# Patient Record
Sex: Female | Born: 1960
Health system: Southern US, Community
[De-identification: ages and names within clinical notes are randomized; demographics above are authoritative.]

## PROBLEM LIST (undated history)

## (undated) DIAGNOSIS — J45909 Unspecified asthma, uncomplicated: Secondary | ICD-10-CM

## (undated) HISTORY — PX: DILATION AND CURETTAGE OF UTERUS: SHX78

---

## 2016-07-26 ENCOUNTER — Other Ambulatory Visit: Payer: Self-pay | Admitting: Internal Medicine

## 2016-07-26 DIAGNOSIS — R600 Localized edema: Secondary | ICD-10-CM

## 2016-08-02 ENCOUNTER — Other Ambulatory Visit: Payer: Self-pay

## 2016-08-02 ENCOUNTER — Ambulatory Visit (HOSPITAL_COMMUNITY): Payer: 59 | Attending: Cardiology

## 2016-08-02 DIAGNOSIS — R600 Localized edema: Secondary | ICD-10-CM | POA: Diagnosis not present

## 2016-08-30 ENCOUNTER — Other Ambulatory Visit: Payer: Self-pay | Admitting: Internal Medicine

## 2016-08-30 DIAGNOSIS — R945 Abnormal results of liver function studies: Principal | ICD-10-CM

## 2016-08-30 DIAGNOSIS — R7989 Other specified abnormal findings of blood chemistry: Secondary | ICD-10-CM

## 2016-08-31 ENCOUNTER — Other Ambulatory Visit: Payer: Self-pay | Admitting: Internal Medicine

## 2016-08-31 ENCOUNTER — Ambulatory Visit
Admission: RE | Admit: 2016-08-31 | Discharge: 2016-08-31 | Disposition: A | Discharge status: Home / Self Care | Payer: 59 | Source: Ambulatory Visit | Attending: Internal Medicine | Admitting: Internal Medicine

## 2016-08-31 DIAGNOSIS — R945 Abnormal results of liver function studies: Principal | ICD-10-CM

## 2016-08-31 DIAGNOSIS — R7989 Other specified abnormal findings of blood chemistry: Secondary | ICD-10-CM

## 2016-08-31 DIAGNOSIS — K769 Liver disease, unspecified: Secondary | ICD-10-CM

## 2016-09-25 ENCOUNTER — Other Ambulatory Visit: Payer: Self-pay | Admitting: Nurse Practitioner

## 2016-09-25 ENCOUNTER — Other Ambulatory Visit (HOSPITAL_COMMUNITY)
Admission: RE | Admit: 2016-09-25 | Discharge: 2016-09-25 | Disposition: A | Discharge status: Home / Self Care | Payer: 59 | Source: Ambulatory Visit | Attending: Nurse Practitioner | Admitting: Nurse Practitioner

## 2016-09-25 DIAGNOSIS — Z124 Encounter for screening for malignant neoplasm of cervix: Secondary | ICD-10-CM | POA: Diagnosis present

## 2016-09-28 LAB — CYTOLOGY - PAP
Diagnosis: NEGATIVE
HPV: NOT DETECTED

## 2016-09-29 ENCOUNTER — Ambulatory Visit
Admission: RE | Admit: 2016-09-29 | Discharge: 2016-09-29 | Disposition: A | Discharge status: Home / Self Care | Payer: 59 | Source: Ambulatory Visit | Attending: Internal Medicine | Admitting: Internal Medicine

## 2016-09-29 DIAGNOSIS — K769 Liver disease, unspecified: Secondary | ICD-10-CM

## 2016-09-29 MED ORDER — GADOBENATE DIMEGLUMINE 529 MG/ML IV SOLN
15.0000 mL | Freq: Once | INTRAVENOUS | Status: AC | PRN
  Administered 2016-09-29: 15 mL via INTRAVENOUS

## 2016-11-30 ENCOUNTER — Other Ambulatory Visit: Payer: Self-pay | Admitting: Internal Medicine

## 2016-11-30 DIAGNOSIS — Z139 Encounter for screening, unspecified: Secondary | ICD-10-CM

## 2016-12-27 ENCOUNTER — Ambulatory Visit
Admission: RE | Admit: 2016-12-27 | Discharge: 2016-12-27 | Disposition: A | Discharge status: Home / Self Care | Payer: 59 | Source: Ambulatory Visit | Attending: Internal Medicine | Admitting: Internal Medicine

## 2016-12-27 DIAGNOSIS — Z139 Encounter for screening, unspecified: Secondary | ICD-10-CM

## 2017-01-02 ENCOUNTER — Other Ambulatory Visit: Payer: Self-pay | Admitting: Nurse Practitioner

## 2018-09-16 ENCOUNTER — Other Ambulatory Visit: Payer: Self-pay

## 2018-09-16 DIAGNOSIS — Z20822 Contact with and (suspected) exposure to covid-19: Secondary | ICD-10-CM

## 2018-09-17 LAB — NOVEL CORONAVIRUS, NAA: SARS-CoV-2, NAA: NOT DETECTED

## 2018-10-31 DIAGNOSIS — Z23 Encounter for immunization: Secondary | ICD-10-CM | POA: Diagnosis not present

## 2019-07-22 DIAGNOSIS — K760 Fatty (change of) liver, not elsewhere classified: Secondary | ICD-10-CM | POA: Diagnosis not present

## 2019-07-22 DIAGNOSIS — E559 Vitamin D deficiency, unspecified: Secondary | ICD-10-CM | POA: Diagnosis not present

## 2019-07-22 DIAGNOSIS — K219 Gastro-esophageal reflux disease without esophagitis: Secondary | ICD-10-CM | POA: Diagnosis not present

## 2019-07-22 DIAGNOSIS — Z13220 Encounter for screening for lipoid disorders: Secondary | ICD-10-CM | POA: Diagnosis not present

## 2019-07-22 DIAGNOSIS — Z23 Encounter for immunization: Secondary | ICD-10-CM | POA: Diagnosis not present

## 2019-07-22 DIAGNOSIS — Z0000 Encounter for general adult medical examination without abnormal findings: Secondary | ICD-10-CM | POA: Diagnosis not present

## 2019-07-22 DIAGNOSIS — Z1389 Encounter for screening for other disorder: Secondary | ICD-10-CM | POA: Diagnosis not present

## 2019-07-22 DIAGNOSIS — J45909 Unspecified asthma, uncomplicated: Secondary | ICD-10-CM | POA: Diagnosis not present

## 2019-07-24 ENCOUNTER — Other Ambulatory Visit: Payer: Self-pay | Admitting: Internal Medicine

## 2019-07-24 DIAGNOSIS — Z1231 Encounter for screening mammogram for malignant neoplasm of breast: Secondary | ICD-10-CM

## 2019-07-24 DIAGNOSIS — M8580 Other specified disorders of bone density and structure, unspecified site: Secondary | ICD-10-CM

## 2019-07-31 ENCOUNTER — Other Ambulatory Visit: Payer: Self-pay

## 2019-07-31 ENCOUNTER — Emergency Department (HOSPITAL_COMMUNITY): Payer: BC Managed Care – PPO

## 2019-07-31 ENCOUNTER — Emergency Department (HOSPITAL_COMMUNITY)
Admission: EM | Admit: 2019-07-31 | Discharge: 2019-07-31 | Disposition: A | Discharge status: Home / Self Care | Payer: BC Managed Care – PPO | Attending: Emergency Medicine | Admitting: Emergency Medicine

## 2019-07-31 ENCOUNTER — Encounter (HOSPITAL_COMMUNITY): Payer: Self-pay

## 2019-07-31 DIAGNOSIS — F410 Panic disorder [episodic paroxysmal anxiety] without agoraphobia: Secondary | ICD-10-CM

## 2019-07-31 DIAGNOSIS — R079 Chest pain, unspecified: Secondary | ICD-10-CM | POA: Diagnosis not present

## 2019-07-31 DIAGNOSIS — R0789 Other chest pain: Secondary | ICD-10-CM | POA: Diagnosis not present

## 2019-07-31 DIAGNOSIS — R519 Headache, unspecified: Secondary | ICD-10-CM | POA: Diagnosis not present

## 2019-07-31 DIAGNOSIS — J45909 Unspecified asthma, uncomplicated: Secondary | ICD-10-CM | POA: Diagnosis not present

## 2019-07-31 DIAGNOSIS — Z7951 Long term (current) use of inhaled steroids: Secondary | ICD-10-CM | POA: Diagnosis not present

## 2019-07-31 HISTORY — DX: Unspecified asthma, uncomplicated: J45.909

## 2019-07-31 LAB — CBC
HCT: 45.7 % (ref 36.0–46.0)
Hemoglobin: 14.5 g/dL (ref 12.0–15.0)
MCH: 29.1 pg (ref 26.0–34.0)
MCHC: 31.7 g/dL (ref 30.0–36.0)
MCV: 91.6 fL (ref 80.0–100.0)
Platelets: 224 10*3/uL (ref 150–400)
RBC: 4.99 MIL/uL (ref 3.87–5.11)
RDW: 13.4 % (ref 11.5–15.5)
WBC: 7 10*3/uL (ref 4.0–10.5)
nRBC: 0 % (ref 0.0–0.2)

## 2019-07-31 LAB — BASIC METABOLIC PANEL
Anion gap: 9 (ref 5–15)
BUN: 15 mg/dL (ref 6–20)
CO2: 24 mmol/L (ref 22–32)
Calcium: 8.8 mg/dL — ABNORMAL LOW (ref 8.9–10.3)
Chloride: 109 mmol/L (ref 98–111)
Creatinine, Ser: 0.99 mg/dL (ref 0.44–1.00)
GFR calc Af Amer: >60 mL/min (ref >60)
GFR calc non Af Amer: >60 mL/min (ref >60)
Glucose, Bld: 106 mg/dL — ABNORMAL HIGH (ref 70–99)
Potassium: 4.4 mmol/L (ref 3.5–5.1)
Sodium: 142 mmol/L (ref 135–145)

## 2019-07-31 LAB — TROPONIN I (HIGH SENSITIVITY)
Troponin I (High Sensitivity): <2 ng/L (ref <18)
Troponin I (High Sensitivity): <2 ng/L (ref <18)

## 2019-07-31 LAB — I-STAT BETA HCG BLOOD, ED (MC, WL, AP ONLY): I-stat hCG, quantitative: <5 m[IU]/mL (ref <5)

## 2019-07-31 MED ORDER — KETOROLAC TROMETHAMINE 30 MG/ML IJ SOLN
30.0000 mg | Freq: Once | INTRAMUSCULAR | Status: AC
  Administered 2019-07-31: 30 mg via INTRAVENOUS
  Filled 2019-07-31: qty 1

## 2019-07-31 MED ORDER — DIPHENHYDRAMINE HCL 50 MG/ML IJ SOLN
12.5000 mg | Freq: Once | INTRAMUSCULAR | Status: AC
  Administered 2019-07-31: 12.5 mg via INTRAVENOUS
  Filled 2019-07-31: qty 1

## 2019-07-31 MED ORDER — METOCLOPRAMIDE HCL 5 MG/ML IJ SOLN
10.0000 mg | Freq: Once | INTRAMUSCULAR | Status: AC
  Administered 2019-07-31: 10 mg via INTRAVENOUS

## 2019-07-31 MED ORDER — METOCLOPRAMIDE HCL 5 MG/ML IJ SOLN
10.0000 mg | Freq: Once | INTRAMUSCULAR | Status: DC
  Filled 2019-07-31: qty 2

## 2019-07-31 MED ORDER — SODIUM CHLORIDE 0.9% FLUSH: 3.0000 mL | Freq: Once | INTRAVENOUS | Status: DC

## 2019-07-31 NOTE — Discharge Instructions (Signed)
You were seen today for panic attack, I want you to use the attached instructions on ways to manage anxiety.  I want you to follow-up with your primary care in the next couple of days.  I also think that you had a migraine today as well, I want you to take over-the-counter NSAIDs as prescribed on the bottle if you start to feel like you have a headache again and follow-up with your primary care about that as well.  If you start having any new or worsening concerning symptoms please come back to the emergency department.  If you have any chest pain with exertion, chest pain radiating, shortness of breath, please come back to the emergency department.  Follow-up with your primary care in the next couple days.

## 2019-07-31 NOTE — ED Triage Notes (Signed)
Patient c/o mid chest tightness since 1000 today.  Patien denies any N/V or diaphoresis, but states she felt "really hot."

## 2019-07-31 NOTE — ED Provider Notes (Signed)
[facility]-EMERGENCY DEPT Provider Note   CSN: [id number] Arrival date & time: 07/31/19  1037     History Chief Complaint  Patient presents with  . Chest Pain    Kendra Daniels is a 59 y.o. female with pertinent past medical history of asthma that presents the emergency department today for chest pain and HA.  Patient states that she was at work and started having some chest tightness around 10, also admits to associated headache, shaking, feeling hot, shortness of breath.  Patient states that she has had an anxiety attack about 10 years ago, states that it did feel similar.  States that it lasted about 10 minutes.  States that the pain was in the center of her chest, denies any radiation or exertional component.  Patient states that she is never had anything like this in the last 10 years.  Denies any cardiac history.  States that she does not have any more chest pain or shortness of breath.  States that she is only left with  headache.  States that the headache is on the right side of her head with some associated right-sided blurry vision.  States that blurry vision has resolved.  States that this is a new headache for her, pain is a 6/10.  Denies any weakness, paresthesias, nausea, vomiting, diaphoresis, abdominal pain, back pain.  States this does not feel like her normal asthma.  Denies any wheezing. Denies any fevers, chills, URI like symptoms. Denies any new medications or stresors.   HPI     Past Medical History:  Diagnosis Date  . Asthma     There are no problems to display for this patient.   Past Surgical History:  Procedure Laterality Date  . DILATION AND CURETTAGE OF UTERUS       OB History   No obstetric history on file.     Family History  Problem Relation Age of Onset  . Hypertension Mother   . Heart attack Father   . Seizures Father   . Hypertension Father     Social History   Tobacco Use  . Smoking status: Never Smoker  .  Smokeless tobacco: Never Used  Vaping Use  . Vaping Use: Never used  Substance Use Topics  . Alcohol use: Never  . Drug use: Never    Home Medications Prior to Admission medications   Medication Sig Start Date End Date Taking? Authorizing Provider  albuterol (VENTOLIN HFA) 108 (90 Base) MCG/ACT inhaler Inhale 2 puffs into the lungs every 6 (six) hours as needed for wheezing or shortness of breath.  07/22/19  Yes [provider]  pantoprazole (PROTONIX) 40 MG tablet Take 40 mg by mouth daily as needed (indigestion).  07/22/19  Yes [provider]  Vitamin D, Ergocalciferol, (DRISDOL) 1.25 MG (50000 UNIT) CAPS capsule Take 50,000 Units by mouth once a week. 07/23/19  Yes [provider]    Allergies    Sulfa antibiotics  Review of Systems   Review of Systems  Constitutional: Negative for chills, diaphoresis, fatigue and fever.  HENT: Negative for congestion, sore throat and trouble swallowing.   Eyes: Negative for pain and visual disturbance.  Respiratory: Negative for cough, shortness of breath and wheezing.   Cardiovascular: Positive for chest pain. Negative for palpitations and leg swelling.  Gastrointestinal: Negative for abdominal distention, abdominal pain, diarrhea, nausea and vomiting.  Genitourinary: Negative for difficulty urinating.  Musculoskeletal: Negative for back pain, neck pain and neck stiffness.  Skin: Negative  for pallor.  Neurological: Positive for headaches. Negative for dizziness, tremors, seizures, syncope, facial asymmetry, speech difficulty, weakness, light-headedness and numbness.  Psychiatric/Behavioral: Negative for confusion.    Physical Exam Updated Vital Signs BP 126/90   Pulse 82   Temp 98 F (36.7 C) (Oral)   Resp (!) 22   Ht 5' 1.5" (1.562 m)   Wt 67.1 kg   SpO2 100%   BMI 27.51 kg/m   Physical Exam Constitutional:      General: She is not in acute distress.    Appearance: Normal appearance. She is not  ill-appearing, toxic-appearing or diaphoretic.  HENT:     Head: Normocephalic and atraumatic.     Mouth/Throat:     Mouth: Mucous membranes are moist.     Pharynx: Oropharynx is clear.  Eyes:     General: No scleral icterus.    Extraocular Movements: Extraocular movements intact.     Pupils: Pupils are equal, round, and reactive to light.  Cardiovascular:     Rate and Rhythm: Normal rate and regular rhythm.     Pulses: Normal pulses.     Heart sounds: Normal heart sounds. No murmur heard.   Pulmonary:     Effort: Pulmonary effort is normal. No tachypnea, accessory muscle usage or respiratory distress.     Breath sounds: Normal breath sounds. No stridor. No decreased breath sounds, wheezing, rhonchi or rales.  Chest:     Chest wall: No tenderness.  Abdominal:     General: Abdomen is flat. There is no distension.     Palpations: Abdomen is soft.     Tenderness: There is no abdominal tenderness. There is no guarding or rebound.  Musculoskeletal:        General: No swelling or tenderness. Normal range of motion.     Cervical back: Normal range of motion and neck supple. No rigidity.     Right lower leg: No edema.     Left lower leg: No edema.  Skin:    General: Skin is warm and dry.     Capillary Refill: Capillary refill takes less than 2 seconds.     Coloration: Skin is not pale.  Neurological:     General: No focal deficit present.     Mental Status: She is alert and oriented to person, place, and time.     Comments: Alert. Clear speech. No facial droop. CNIII-XII grossly intact. Bilateral upper and lower extremities' sensation grossly intact. 5/5 symmetric strength with grip strength and with plantar and dorsi flexion bilaterally. Patellar DTRs are 2+ and symmetric .  Gait is steady and intact    Psychiatric:        Mood and Affect: Mood normal.        Behavior: Behavior normal.     ED Results / Procedures / Treatments   Labs (all labs ordered are listed, but only  abnormal results are displayed) Labs Reviewed  BASIC METABOLIC PANEL - Abnormal; Notable for the following components:      Result Value   Glucose, Bld 106 (*)    Calcium 8.8 (*)    All other components within normal limits  CBC  I-STAT BETA HCG BLOOD, ED ([facility], [facility], [facility] ONLY)  TROPONIN I (HIGH SENSITIVITY)  TROPONIN I (HIGH SENSITIVITY)    EKG None  Radiology DG Chest 2 View  Result Date: 07/31/2019 CLINICAL DATA:  Chest pain EXAM: CHEST - 2 VIEW COMPARISON:  None. FINDINGS: The lungs are clear. The heart size and pulmonary vascularity  are normal. No adenopathy. No pneumothorax. No bone lesions. IMPRESSION: Lungs clear.  Cardiac silhouette normal. Electronically Signed   By: Bretta Bang III M.D.   On: 07/31/2019 12:07   CT Head Wo Contrast  Result Date: 07/31/2019 CLINICAL DATA:  Headache, right vision change EXAM: CT HEAD WITHOUT CONTRAST TECHNIQUE: Contiguous axial images were obtained from the base of the skull through the vertex without intravenous contrast. COMPARISON:  None. FINDINGS: Brain: Normal anatomic configuration. No abnormal intra or extra-axial mass lesion or fluid collection. No abnormal mass effect or midline shift. No evidence of acute intracranial hemorrhage or infarct. Ventricular size is normal. Cerebellum unremarkable. Vascular: Unremarkable Skull: Intact Sinuses/Orbits: Paranasal sinuses are clear. Orbits are unremarkable. Other: Mastoid air cells and middle ear cavities are clear. IMPRESSION: Normal exam Electronically Signed   By: Helyn Numbers MD   On: 07/31/2019 14:20    Procedures Procedures (including critical care time)  Medications Ordered in ED Medications  sodium chloride flush (NS) 0.9 % injection 3 mL (has no administration in time range)  diphenhydrAMINE (BENADRYL) injection 12.5 mg (12.5 mg Intravenous Given 07/31/19 1537)  ketorolac (TORADOL) 30 MG/ML injection 30 mg (30 mg Intravenous Given 07/31/19 1536)  metoCLOPramide (REGLAN) injection 10 mg  (10 mg Intravenous Given 07/31/19 1537)    ED Course  I have reviewed the triage vital signs and the nursing notes.  Pertinent labs & imaging results that were available during my care of the patient were reviewed by me and considered in my medical decision making (see chart for details).    MDM Rules/Calculators/A&P                         Kendra Daniels is a 59 y.o. female with pertinent past medical history of asthma that presents the emergency department today for chest pain and headache.  Patient states that headache is not like her nomral headache, states that she also had some vision changes, denies them anymore. States this occurred when having her panic like symptoms. Normal peripheral and central vision.  Will obtain CT scan at this time.  We will also do normal ACS work-up.  Although this is most likely due to panic attack since patient is denying any more symptoms of chest pain without intervention.  CBC and CMP without any acute abnormalities, 2 negative troponins, CT head negative.  Chest x-ray without any cardiopulmonary disease.  EKG without any signs of ischemia.  Migraine cocktail given with a lot of improvement.  After assessment patient states that she feels much better, has not had any chest pain while in the ER, states that head feels better as well. HEAR score 1. I think that her symptoms were due to a panic attack, pt also thinks this. Pt to follow up with PCP about migraines and panic attack. Discussed strict return precautions about ACS.   Doubt need for further emergent work up at this time. I explained the diagnosis and have given explicit precautions to return to the ER including for any other new or worsening symptoms. The patient understands and accepts the medical plan as it's been dictated and I have answered their questions. Discharge instructions concerning home care and prescriptions have been given. The patient is STABLE and is discharged to home in good  condition.     Final Clinical Impression(s) / ED Diagnoses Final diagnoses:  Panic attack  Acute intractable headache, unspecified headache type    Rx / DC Orders ED Discharge  Orders    None       Farrel Gordon, PA-C 07/31/19 Farley Ly, MD 08/06/19 [phone]

## 2019-08-03 DIAGNOSIS — Z03818 Encounter for observation for suspected exposure to other biological agents ruled out: Secondary | ICD-10-CM | POA: Diagnosis not present

## 2019-10-21 ENCOUNTER — Ambulatory Visit
Admission: RE | Admit: 2019-10-21 | Discharge: 2019-10-21 | Disposition: A | Discharge status: Home / Self Care | Payer: BC Managed Care – PPO | Source: Ambulatory Visit | Attending: Internal Medicine | Admitting: Internal Medicine

## 2019-10-21 ENCOUNTER — Other Ambulatory Visit: Payer: Self-pay

## 2019-10-21 DIAGNOSIS — Z1231 Encounter for screening mammogram for malignant neoplasm of breast: Secondary | ICD-10-CM | POA: Diagnosis not present

## 2019-10-21 DIAGNOSIS — Z780 Asymptomatic menopausal state: Secondary | ICD-10-CM | POA: Diagnosis not present

## 2019-10-21 DIAGNOSIS — M8580 Other specified disorders of bone density and structure, unspecified site: Secondary | ICD-10-CM

## 2019-10-21 DIAGNOSIS — M8589 Other specified disorders of bone density and structure, multiple sites: Secondary | ICD-10-CM | POA: Diagnosis not present

## 2020-01-21 DIAGNOSIS — E559 Vitamin D deficiency, unspecified: Secondary | ICD-10-CM | POA: Diagnosis not present

## 2020-08-30 IMAGING — CT CT HEAD W/O CM
3 series · 15 of 45 positions shown, 18 images · non-contrast
Comparison: None.

CLINICAL DATA: Headache, right vision change

EXAM:
CT HEAD WITHOUT CONTRAST
TECHNIQUE: Contiguous axial images were obtained from the base of the skull
through the vertex without intravenous contrast.

[Series 2: head wo · axial · 0.37mm/px · z∈[-137,-22]mm · 9 of 28 slices shown, 12 images]
[im 3/28  brain]
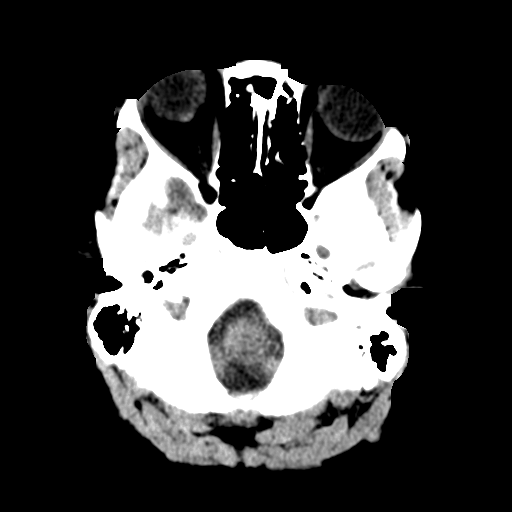
[im 3/28  bone]
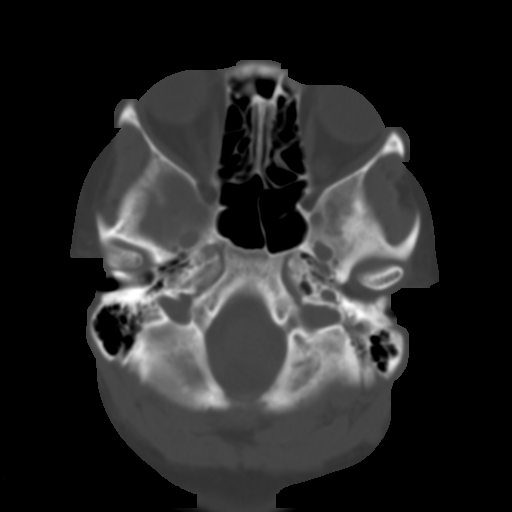
[im 6/28  brain]
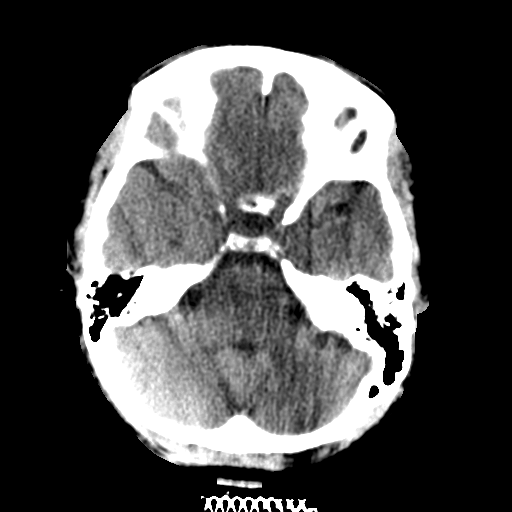
[im 9/28  brain]
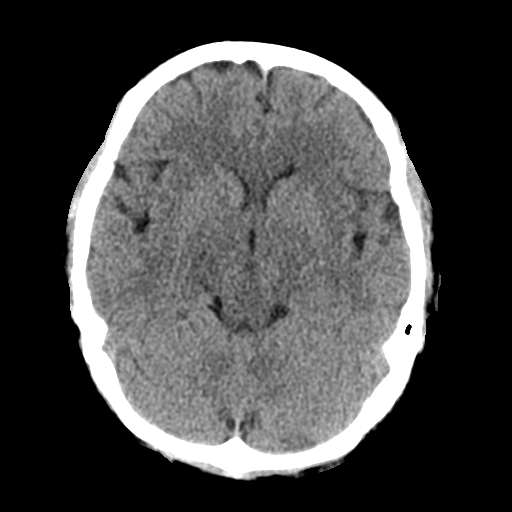
[im 12/28  brain]
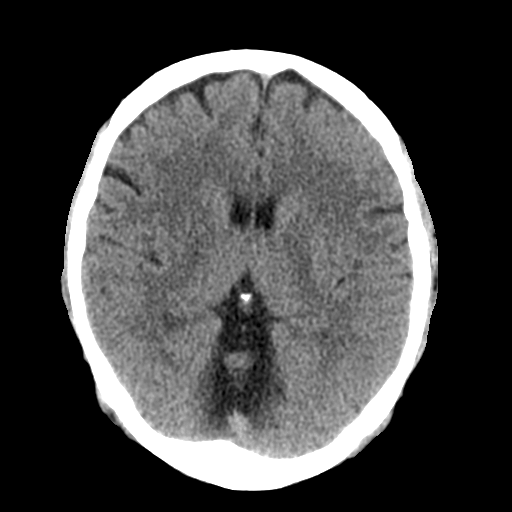
[im 15/28  brain]
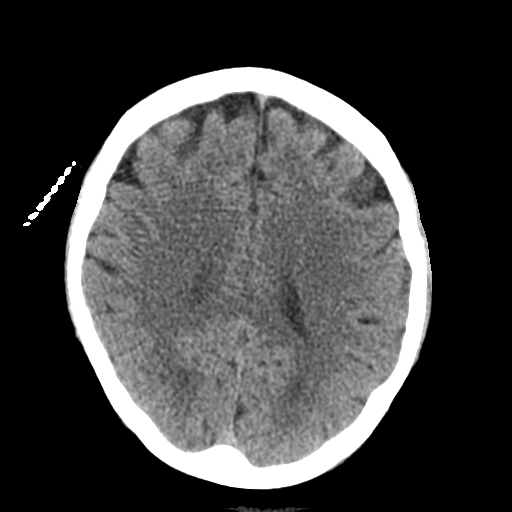
[im 15/28  bone]
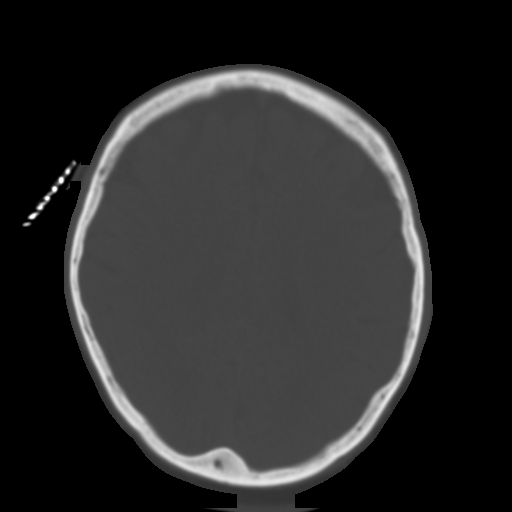
[im 17/28  brain]
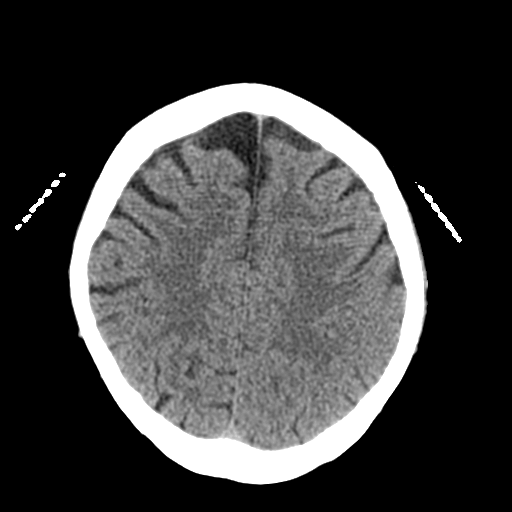
[im 20/28  brain]
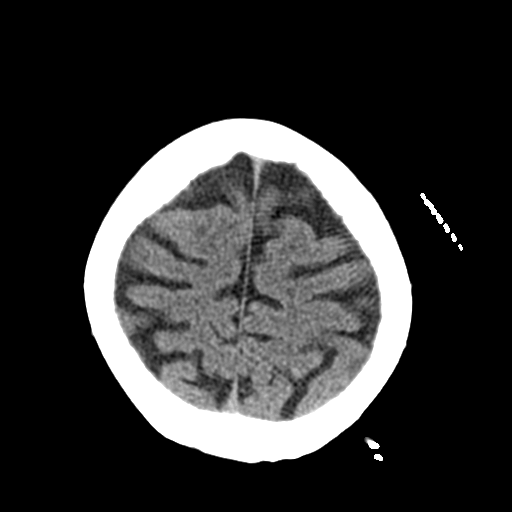
[im 23/28  brain]
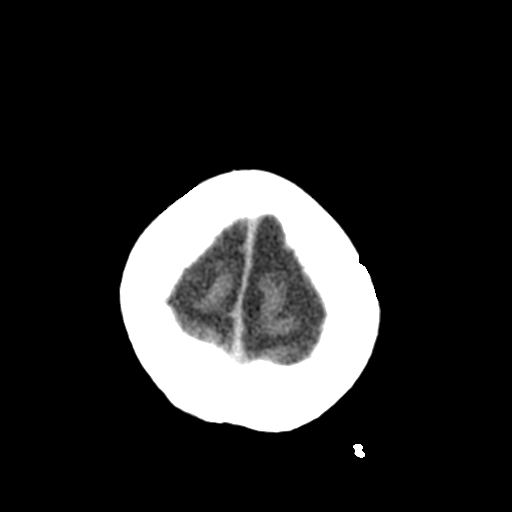
[im 26/28  brain]
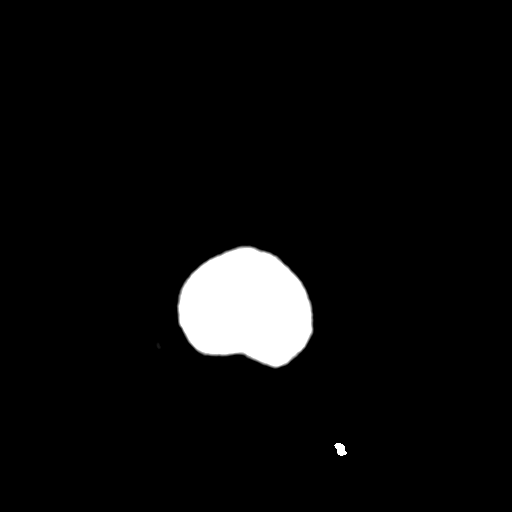
[im 26/28  bone]
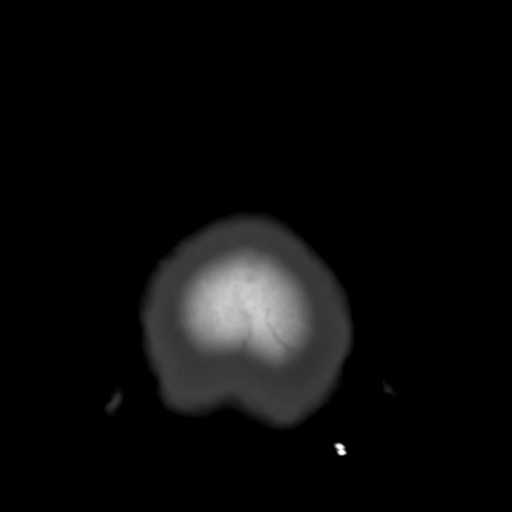

[Series 4: coronal soft tissue · coronal · 0.27mm/px · 3 of 58 slices shown]
[im 20/58  brain]
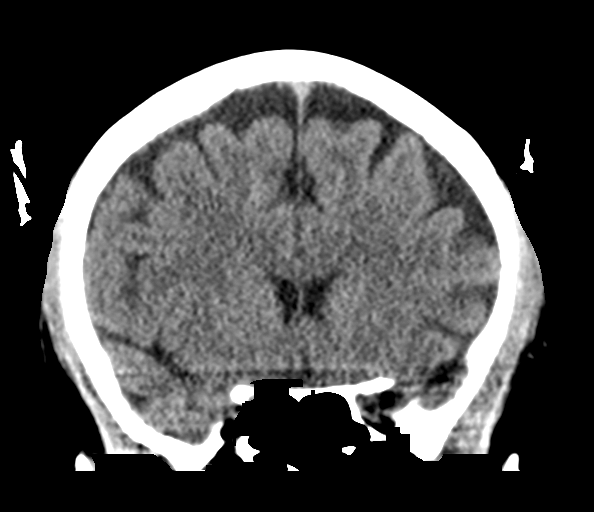
[im 26/58  brain]
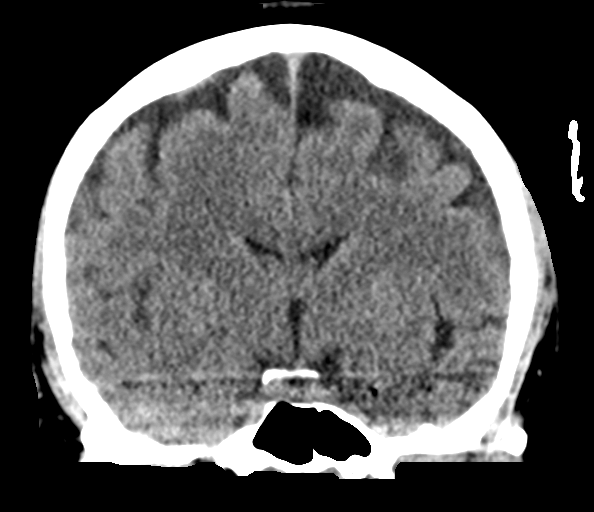
[im 32/58  brain]
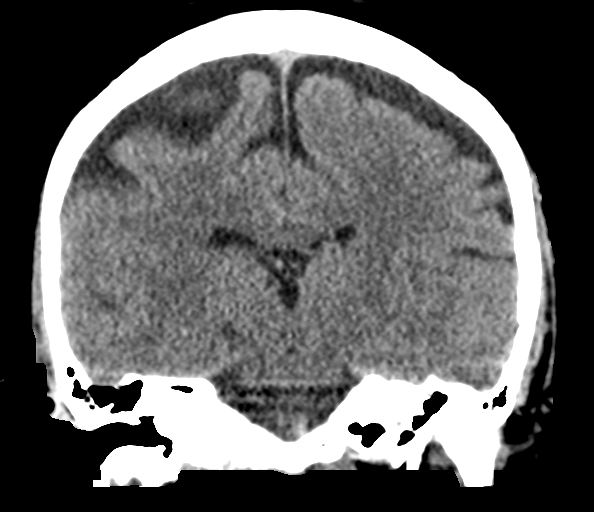

[Series 5: sagittal soft tissue · sagittal · 0.27mm/px · 3 of 50 slices shown]
[im 17/50  brain]
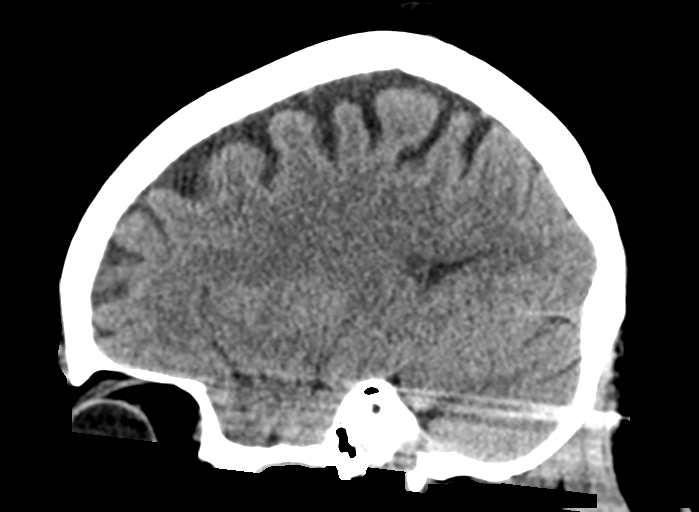
[im 25/50  brain]
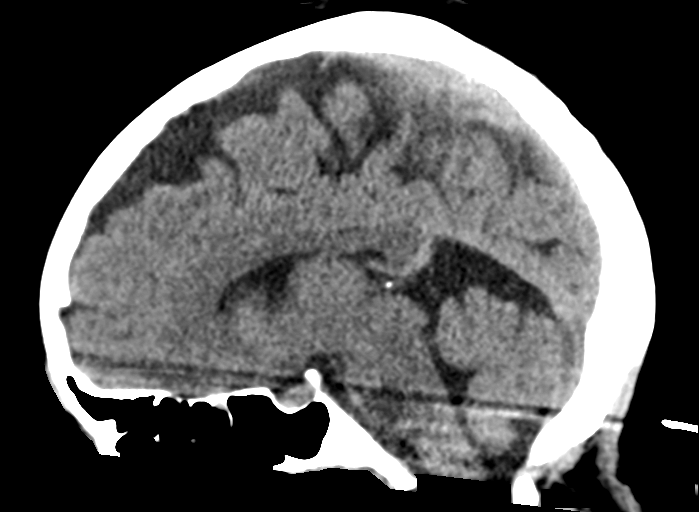
[im 33/50  brain]
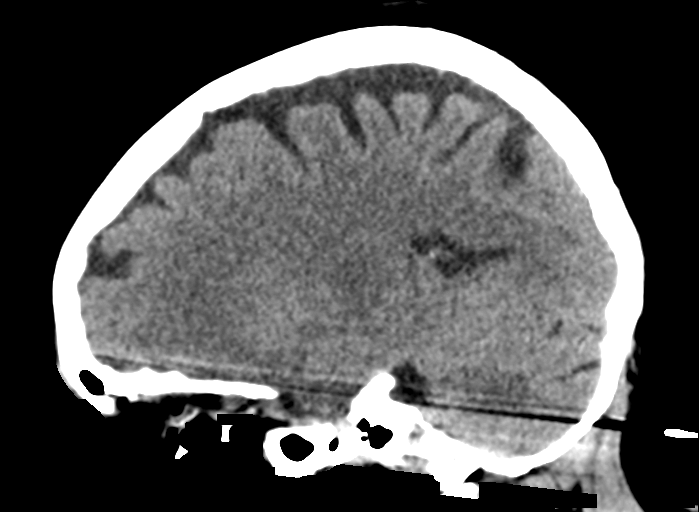

[15 of 45 positions shown; findings below may reference images not displayed]

FINDINGS: Brain: Normal anatomic configuration. No abnormal intra or
extra-axial mass lesion or fluid collection. No abnormal mass effect
or midline shift. No evidence of acute intracranial hemorrhage or
infarct. Ventricular size is normal. Cerebellum unremarkable.

Vascular: Unremarkable

Skull: Intact

Sinuses/Orbits: Paranasal sinuses are clear. Orbits are
unremarkable.

Other: Mastoid air cells and middle ear cavities are clear.
IMPRESSION: Normal exam

## 2020-08-30 IMAGING — CR DG CHEST 2V
2 series · 2 of 2 positions shown · non-contrast
Comparison: None.

CLINICAL DATA: Chest pain

EXAM:
CHEST - 2 VIEW

[w chest pa]
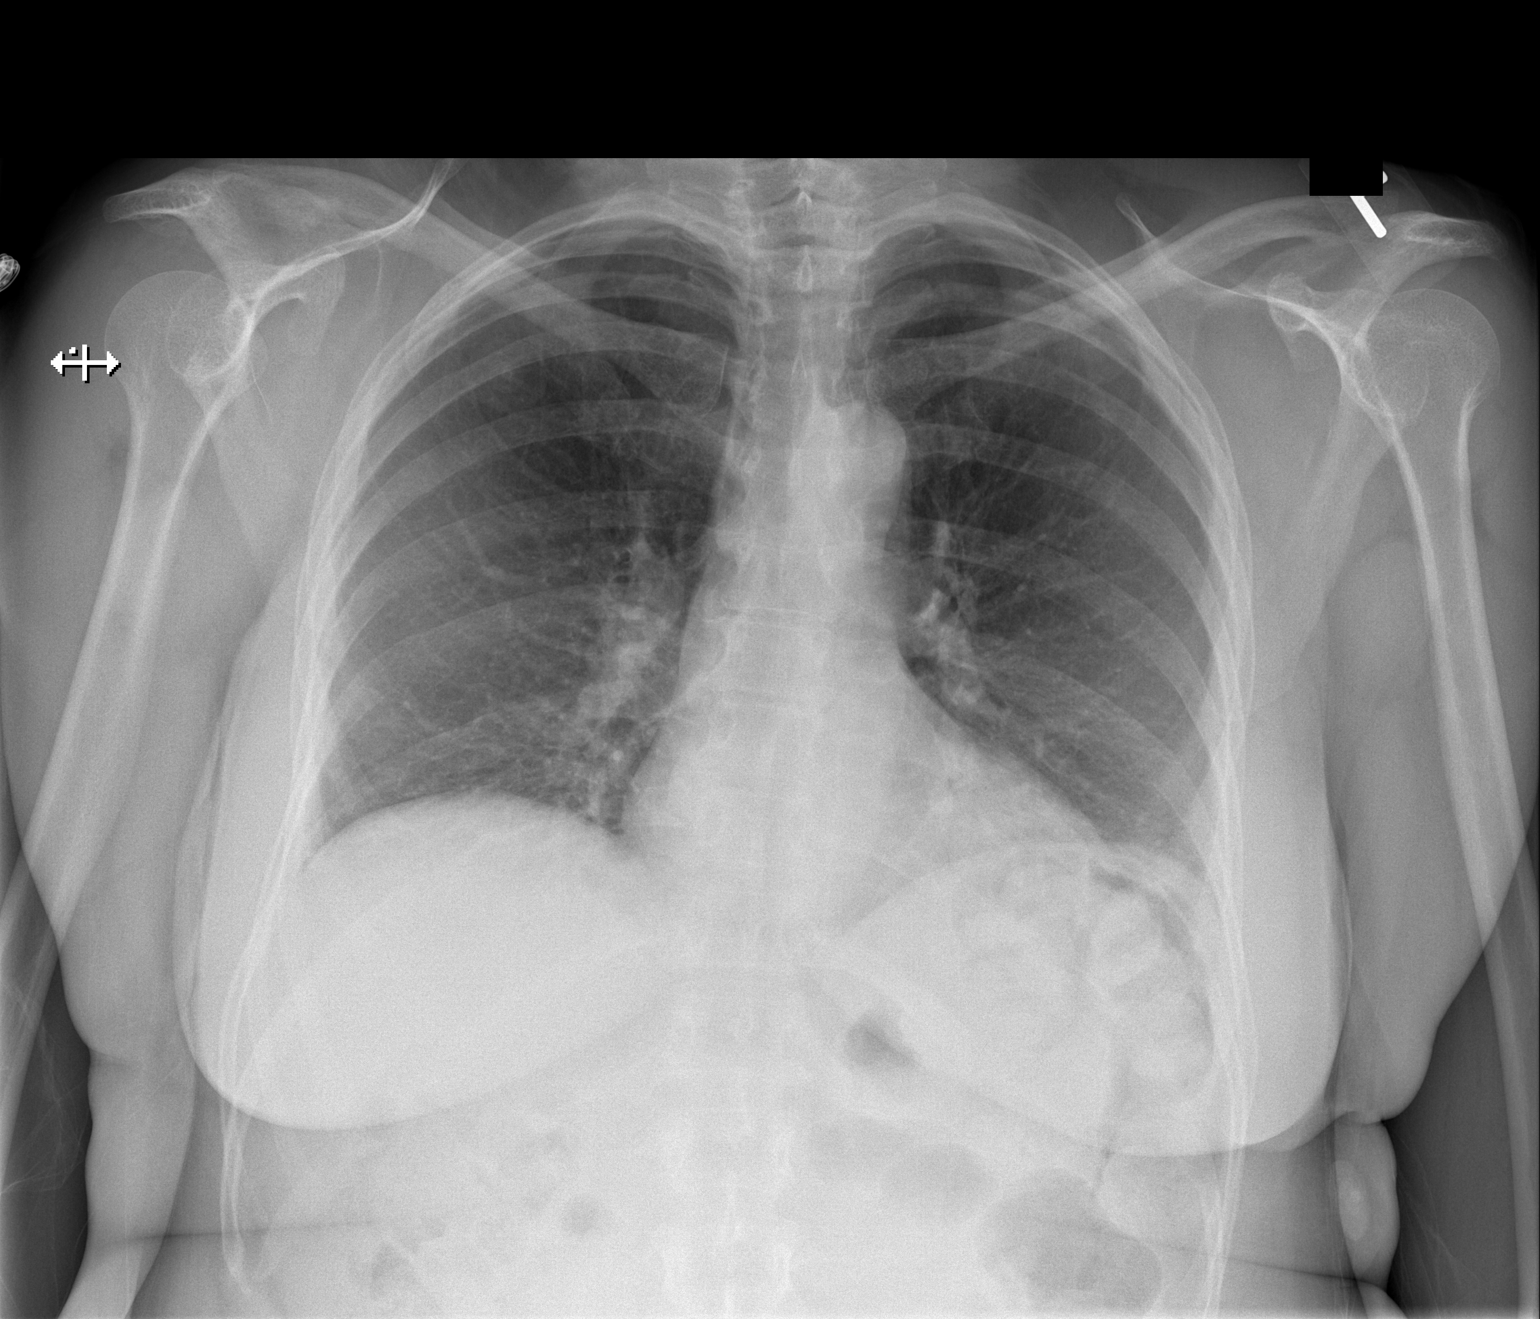

[w chest lat]
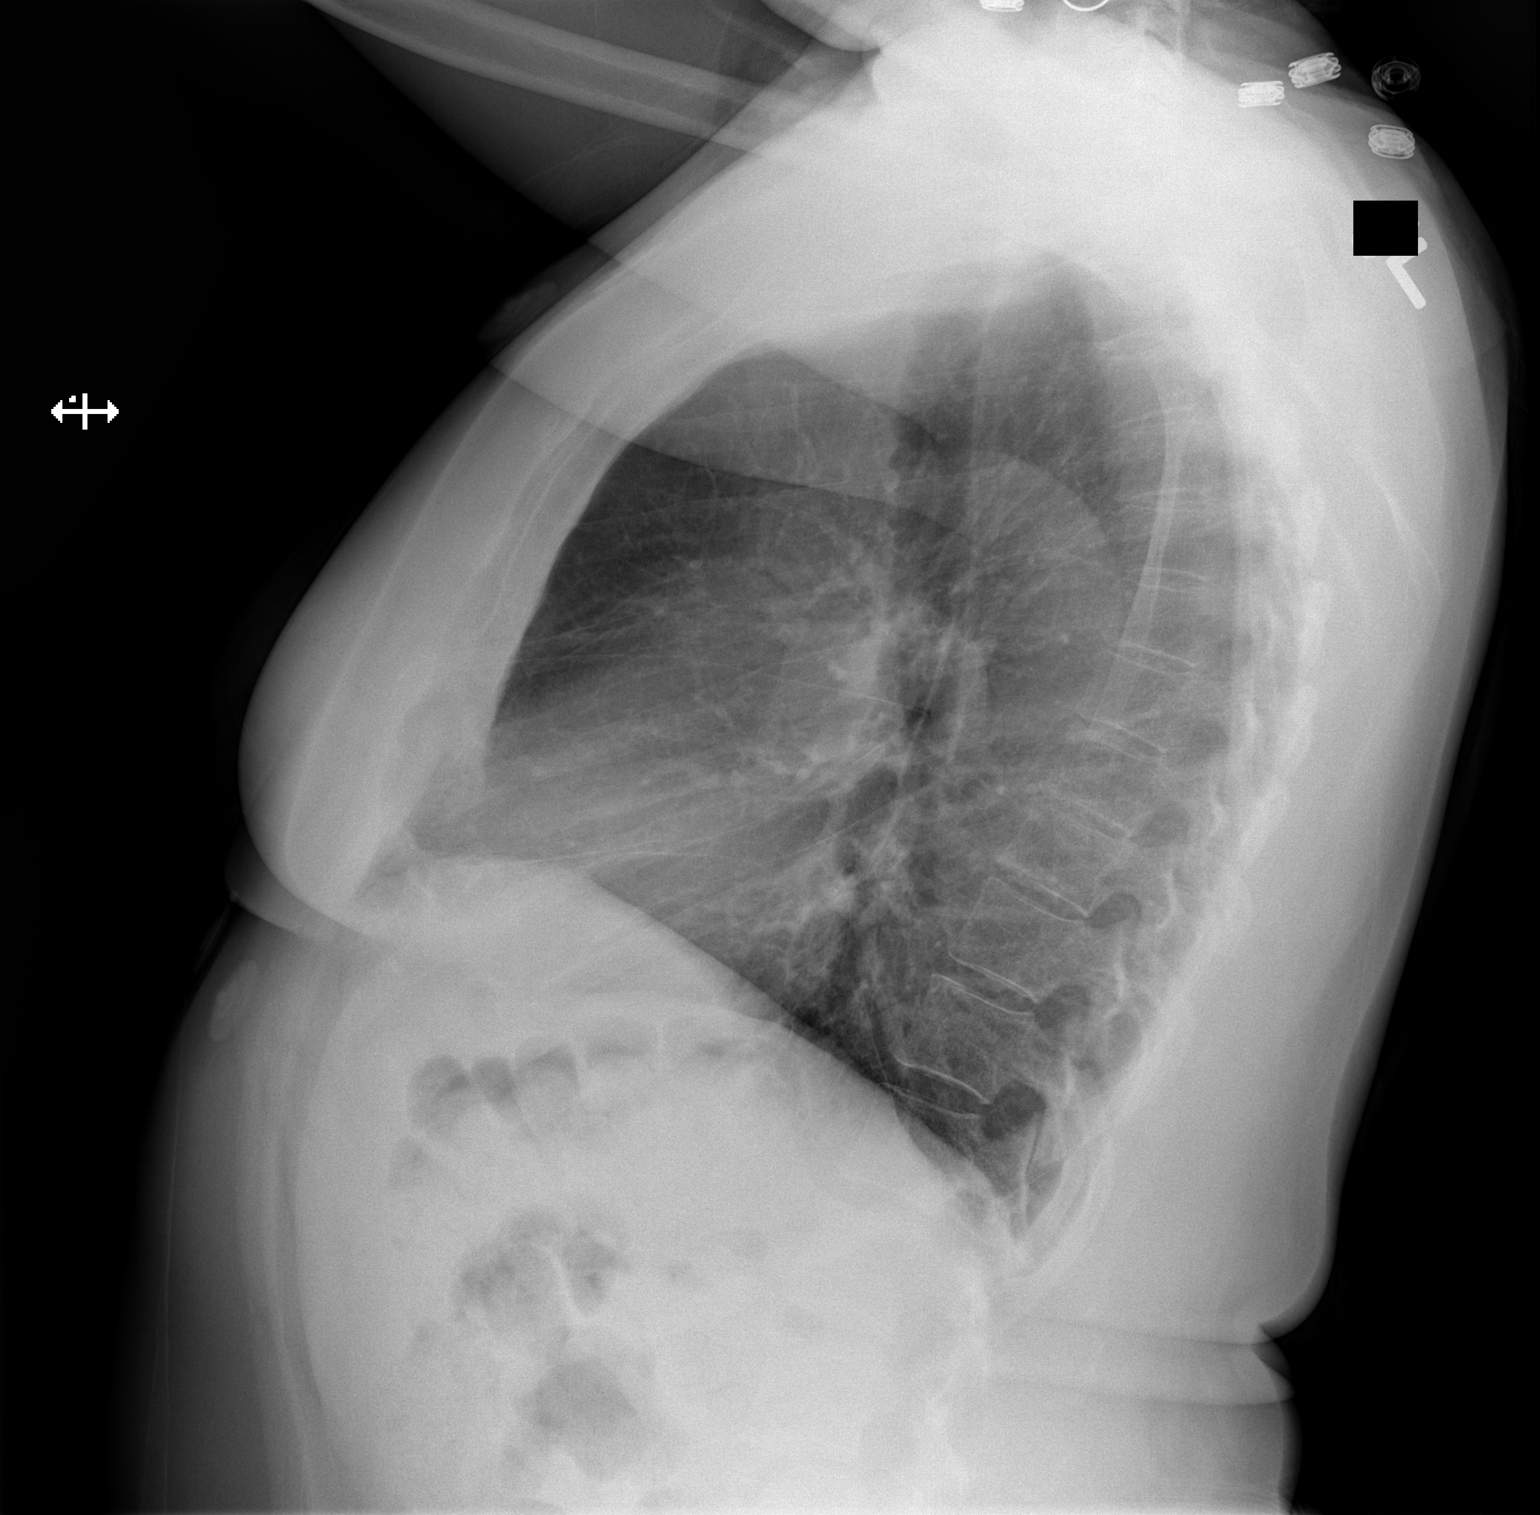

[2 of 2 positions shown; findings below may reference images not displayed]

FINDINGS: The lungs are clear. The heart size and pulmonary vascularity are
normal. No adenopathy. No pneumothorax. No bone lesions.
IMPRESSION: Lungs clear.  Cardiac silhouette normal.

## 2020-09-09 DIAGNOSIS — D123 Benign neoplasm of transverse colon: Secondary | ICD-10-CM | POA: Diagnosis not present

## 2020-09-09 DIAGNOSIS — K523 Indeterminate colitis: Secondary | ICD-10-CM | POA: Diagnosis not present

## 2020-09-09 DIAGNOSIS — K633 Ulcer of intestine: Secondary | ICD-10-CM | POA: Diagnosis not present

## 2020-09-09 DIAGNOSIS — K642 Third degree hemorrhoids: Secondary | ICD-10-CM | POA: Diagnosis not present

## 2020-09-09 DIAGNOSIS — K5730 Diverticulosis of large intestine without perforation or abscess without bleeding: Secondary | ICD-10-CM | POA: Diagnosis not present

## 2020-09-09 DIAGNOSIS — D125 Benign neoplasm of sigmoid colon: Secondary | ICD-10-CM | POA: Diagnosis not present

## 2020-09-09 DIAGNOSIS — D122 Benign neoplasm of ascending colon: Secondary | ICD-10-CM | POA: Diagnosis not present

## 2020-09-09 DIAGNOSIS — Z1211 Encounter for screening for malignant neoplasm of colon: Secondary | ICD-10-CM | POA: Diagnosis not present

## 2020-09-14 DIAGNOSIS — E559 Vitamin D deficiency, unspecified: Secondary | ICD-10-CM | POA: Diagnosis not present

## 2020-09-14 DIAGNOSIS — Z13220 Encounter for screening for lipoid disorders: Secondary | ICD-10-CM | POA: Diagnosis not present

## 2020-09-14 DIAGNOSIS — Z0000 Encounter for general adult medical examination without abnormal findings: Secondary | ICD-10-CM | POA: Diagnosis not present

## 2020-09-16 ENCOUNTER — Other Ambulatory Visit: Payer: Self-pay | Admitting: Internal Medicine

## 2020-09-16 DIAGNOSIS — Z1231 Encounter for screening mammogram for malignant neoplasm of breast: Secondary | ICD-10-CM

## 2020-10-14 DIAGNOSIS — Z1231 Encounter for screening mammogram for malignant neoplasm of breast: Secondary | ICD-10-CM

## 2020-10-28 ENCOUNTER — Other Ambulatory Visit: Payer: Self-pay

## 2020-10-28 ENCOUNTER — Ambulatory Visit
Admission: RE | Admit: 2020-10-28 | Discharge: 2020-10-28 | Disposition: A | Discharge status: Home / Self Care | Payer: BC Managed Care – PPO | Source: Ambulatory Visit | Attending: Internal Medicine | Admitting: Internal Medicine

## 2020-10-28 DIAGNOSIS — Z1231 Encounter for screening mammogram for malignant neoplasm of breast: Secondary | ICD-10-CM

## 2020-11-03 ENCOUNTER — Other Ambulatory Visit: Payer: Self-pay | Admitting: Internal Medicine

## 2020-11-03 DIAGNOSIS — R928 Other abnormal and inconclusive findings on diagnostic imaging of breast: Secondary | ICD-10-CM

## 2020-11-15 ENCOUNTER — Ambulatory Visit
Admission: RE | Admit: 2020-11-15 | Discharge: 2020-11-15 | Disposition: A | Discharge status: Home / Self Care | Payer: BC Managed Care – PPO | Source: Ambulatory Visit | Attending: Internal Medicine | Admitting: Internal Medicine

## 2020-11-15 ENCOUNTER — Other Ambulatory Visit: Payer: Self-pay

## 2020-11-15 DIAGNOSIS — R928 Other abnormal and inconclusive findings on diagnostic imaging of breast: Secondary | ICD-10-CM

## 2020-11-15 DIAGNOSIS — N6489 Other specified disorders of breast: Secondary | ICD-10-CM | POA: Diagnosis not present

## 2020-11-15 DIAGNOSIS — R922 Inconclusive mammogram: Secondary | ICD-10-CM | POA: Diagnosis not present

## 2021-08-23 ENCOUNTER — Other Ambulatory Visit: Payer: Self-pay | Admitting: Internal Medicine

## 2021-08-23 ENCOUNTER — Ambulatory Visit
Admission: RE | Admit: 2021-08-23 | Discharge: 2021-08-23 | Disposition: A | Discharge status: Home / Self Care | Payer: BC Managed Care – PPO | Source: Ambulatory Visit | Attending: Internal Medicine | Admitting: Internal Medicine

## 2021-08-23 DIAGNOSIS — M545 Low back pain, unspecified: Secondary | ICD-10-CM | POA: Diagnosis not present

## 2021-08-23 DIAGNOSIS — M549 Dorsalgia, unspecified: Secondary | ICD-10-CM

## 2021-08-30 DIAGNOSIS — M5459 Other low back pain: Secondary | ICD-10-CM | POA: Diagnosis not present

## 2021-09-04 DIAGNOSIS — M5459 Other low back pain: Secondary | ICD-10-CM | POA: Diagnosis not present

## 2021-09-14 DIAGNOSIS — Z13220 Encounter for screening for lipoid disorders: Secondary | ICD-10-CM | POA: Diagnosis not present

## 2021-09-14 DIAGNOSIS — Z23 Encounter for immunization: Secondary | ICD-10-CM | POA: Diagnosis not present

## 2021-09-14 DIAGNOSIS — Z0000 Encounter for general adult medical examination without abnormal findings: Secondary | ICD-10-CM | POA: Diagnosis not present

## 2021-09-14 DIAGNOSIS — D869 Sarcoidosis, unspecified: Secondary | ICD-10-CM | POA: Diagnosis not present

## 2021-09-14 DIAGNOSIS — E559 Vitamin D deficiency, unspecified: Secondary | ICD-10-CM | POA: Diagnosis not present

## 2021-09-14 DIAGNOSIS — K219 Gastro-esophageal reflux disease without esophagitis: Secondary | ICD-10-CM | POA: Diagnosis not present

## 2021-09-14 DIAGNOSIS — K760 Fatty (change of) liver, not elsewhere classified: Secondary | ICD-10-CM | POA: Diagnosis not present

## 2021-09-26 DIAGNOSIS — M25572 Pain in left ankle and joints of left foot: Secondary | ICD-10-CM | POA: Diagnosis not present

## 2021-10-04 DIAGNOSIS — Z124 Encounter for screening for malignant neoplasm of cervix: Secondary | ICD-10-CM | POA: Diagnosis not present

## 2021-10-04 DIAGNOSIS — Z1151 Encounter for screening for human papillomavirus (HPV): Secondary | ICD-10-CM | POA: Diagnosis not present

## 2021-10-04 DIAGNOSIS — Z01419 Encounter for gynecological examination (general) (routine) without abnormal findings: Secondary | ICD-10-CM | POA: Diagnosis not present

## 2021-10-13 ENCOUNTER — Other Ambulatory Visit: Payer: Self-pay | Admitting: Internal Medicine

## 2021-10-13 DIAGNOSIS — Z1231 Encounter for screening mammogram for malignant neoplasm of breast: Secondary | ICD-10-CM

## 2021-10-17 DIAGNOSIS — M545 Low back pain, unspecified: Secondary | ICD-10-CM | POA: Diagnosis not present

## 2021-10-17 DIAGNOSIS — M25572 Pain in left ankle and joints of left foot: Secondary | ICD-10-CM | POA: Diagnosis not present

## 2021-11-03 DIAGNOSIS — Z23 Encounter for immunization: Secondary | ICD-10-CM | POA: Diagnosis not present

## 2021-11-16 ENCOUNTER — Ambulatory Visit
Admission: RE | Admit: 2021-11-16 | Discharge: 2021-11-16 | Disposition: A | Discharge status: Home / Self Care | Payer: BC Managed Care – PPO | Source: Ambulatory Visit

## 2021-11-16 DIAGNOSIS — M62830 Muscle spasm of back: Secondary | ICD-10-CM | POA: Diagnosis not present

## 2021-11-16 DIAGNOSIS — M9903 Segmental and somatic dysfunction of lumbar region: Secondary | ICD-10-CM | POA: Diagnosis not present

## 2021-11-16 DIAGNOSIS — M545 Low back pain, unspecified: Secondary | ICD-10-CM | POA: Diagnosis not present

## 2021-11-16 DIAGNOSIS — M9901 Segmental and somatic dysfunction of cervical region: Secondary | ICD-10-CM | POA: Diagnosis not present

## 2021-11-16 DIAGNOSIS — Z1231 Encounter for screening mammogram for malignant neoplasm of breast: Secondary | ICD-10-CM

## 2021-11-22 DIAGNOSIS — M9901 Segmental and somatic dysfunction of cervical region: Secondary | ICD-10-CM | POA: Diagnosis not present

## 2021-11-22 DIAGNOSIS — M545 Low back pain, unspecified: Secondary | ICD-10-CM | POA: Diagnosis not present

## 2021-11-22 DIAGNOSIS — M62830 Muscle spasm of back: Secondary | ICD-10-CM | POA: Diagnosis not present

## 2021-11-22 DIAGNOSIS — M9903 Segmental and somatic dysfunction of lumbar region: Secondary | ICD-10-CM | POA: Diagnosis not present

## 2022-02-08 DIAGNOSIS — H00021 Hordeolum internum right upper eyelid: Secondary | ICD-10-CM | POA: Diagnosis not present

## 2022-02-24 DIAGNOSIS — H00021 Hordeolum internum right upper eyelid: Secondary | ICD-10-CM | POA: Diagnosis not present

## 2022-02-28 DIAGNOSIS — Z23 Encounter for immunization: Secondary | ICD-10-CM | POA: Diagnosis not present

## 2022-12-18 ENCOUNTER — Other Ambulatory Visit: Payer: Self-pay | Admitting: Internal Medicine

## 2022-12-18 DIAGNOSIS — Z1231 Encounter for screening mammogram for malignant neoplasm of breast: Secondary | ICD-10-CM

## 2023-01-17 ENCOUNTER — Ambulatory Visit
Admission: RE | Admit: 2023-01-17 | Discharge: 2023-01-17 | Disposition: A | Discharge status: Home / Self Care | Payer: Commercial Managed Care - PPO | Source: Ambulatory Visit

## 2023-01-17 DIAGNOSIS — Z1231 Encounter for screening mammogram for malignant neoplasm of breast: Secondary | ICD-10-CM
